# Patient Record
Sex: Male | Born: 1985 | Race: Black or African American | Hispanic: No | Marital: Single | Smoking: Current every day smoker
Health system: Southern US, Community
[De-identification: ages and names within clinical notes are randomized; demographics above are authoritative.]

---

## 2018-03-04 ENCOUNTER — Emergency Department (HOSPITAL_COMMUNITY): Payer: Self-pay

## 2018-03-04 ENCOUNTER — Emergency Department (HOSPITAL_COMMUNITY)
Admission: EM | Admit: 2018-03-04 | Discharge: 2018-03-04 | Disposition: A | Discharge status: Home / Self Care | Payer: Self-pay | Attending: Emergency Medicine | Admitting: Emergency Medicine

## 2018-03-04 ENCOUNTER — Other Ambulatory Visit: Payer: Self-pay

## 2018-03-04 ENCOUNTER — Encounter (HOSPITAL_COMMUNITY): Payer: Self-pay | Admitting: Obstetrics and Gynecology

## 2018-03-04 DIAGNOSIS — S99911A Unspecified injury of right ankle, initial encounter: Secondary | ICD-10-CM | POA: Insufficient documentation

## 2018-03-04 DIAGNOSIS — M25571 Pain in right ankle and joints of right foot: Secondary | ICD-10-CM

## 2018-03-04 DIAGNOSIS — F17210 Nicotine dependence, cigarettes, uncomplicated: Secondary | ICD-10-CM | POA: Insufficient documentation

## 2018-03-04 DIAGNOSIS — Y92002 Bathroom of unspecified non-institutional (private) residence single-family (private) house as the place of occurrence of the external cause: Secondary | ICD-10-CM | POA: Insufficient documentation

## 2018-03-04 DIAGNOSIS — X500XXA Overexertion from strenuous movement or load, initial encounter: Secondary | ICD-10-CM | POA: Insufficient documentation

## 2018-03-04 DIAGNOSIS — Y93E1 Activity, personal bathing and showering: Secondary | ICD-10-CM | POA: Insufficient documentation

## 2018-03-04 DIAGNOSIS — Y998 Other external cause status: Secondary | ICD-10-CM | POA: Insufficient documentation

## 2018-03-04 NOTE — ED Triage Notes (Signed)
Per Pt: Pt reports pain and burning in his right ankle. Pt reports he fell out of the shower and rolled his ankle.

## 2018-03-04 NOTE — Discharge Instructions (Addendum)
You have been seen today for an ankle/foot injury. There were no acute abnormalities on the x-rays, including no sign of fracture or dislocation, however, there could be injuries to the soft tissues, such as the ligaments or tendons that are not seen on xrays. There could also be what are called occult fractures that are small fractures not seen on xray. Antiinflammatory medications: Take 600 mg of ibuprofen every 6 hours or 440 mg (over the counter dose) to 500 mg (prescription dose) of naproxen every 12 hours for the next 3 days. After this time, these medications may be used as needed for pain. Take these medications with food to avoid upset stomach. Choose only one of these medications, do not take them together. Acetaminophen (generic for Tylenol): Should you continue to have additional pain while taking the ibuprofen or naproxen, you may add in acetaminophen as needed. Your daily total maximum amount of acetaminophen from all sources should be limited to 4000mg /day for persons without liver problems, or 2000mg /day for those with liver problems. Ice: May apply ice to the area over the next 24 hours for 15 minutes at a time to reduce swelling. Elevation: Keep the extremity elevated as often as possible to reduce pain and inflammation. Support: Wear the ankle brace for support and comfort. Wear this until pain resolves. You will be weight-bearing as tolerated, which means you can slowly start to put weight on the extremity and increase amount and frequency as pain allows. Exercises: Start by performing these exercises a few times a week, increasing the frequency until you are performing them twice daily.  Follow up: If symptoms are improving, you may follow up with your primary care provider for any continued management. If symptoms are not starting to improve within a week, you should follow up with the foot/ankle specialist within two weeks. Return: Return to the ED for numbness, weakness, increasing  pain, overall worsening symptoms, loss of function, or if symptoms are not improving, you have tried to follow up with the foot/ankle specialist, and have been unable to do so.  For prescription assistance, may try using prescription discount sites or apps, such as goodrx.com

## 2018-03-04 NOTE — ED Provider Notes (Signed)
[facility]-EMERGENCY DEPT Provider Note   CSN: [id number] Arrival date & time: 03/04/18  1202     History   Chief Complaint Chief Complaint  Patient presents with  . Ankle Pain    HPI Brandon Drake is a 32 y.o. male.  HPI   Brandon Drake is a 32 y.o. male, patient with no pertinent past medical history, presenting to the ED with right ankle pain beginning last night.  States he "rolled" his right ankle getting out of the shower.  He has burning pain to the lateral, proximal right foot into the lateral ankle, moderate in intensity, only occurs with bearing weight.  He also notes a "clicking sound" in the ankle when he moves around.  He has no pain when at rest. Denies numbness, weakness, swelling, other injuries, or any other complaints.     History reviewed. No pertinent past medical history.  There are no active problems to display for this patient.   History reviewed. No pertinent surgical history.      Home Medications    Prior to Admission medications   Not on File    Family History No family history on file.  Social History Social History   Tobacco Use  . Smoking status: Current Every Day Smoker    Packs/day: 0.50    Years: 10.00    Pack years: 5.00    Types: Cigarettes  . Smokeless tobacco: Never Used  Substance Use Topics  . Alcohol use: Yes    Alcohol/week: 7.0 standard drinks    Types: 7 Cans of beer per week  . Drug use: Yes    Types: Marijuana     Allergies   Patient has no known allergies.   Review of Systems Review of Systems  Musculoskeletal: Positive for arthralgias. Negative for joint swelling.  Neurological: Negative for weakness and numbness.     Physical Exam Updated Vital Signs BP 129/79 (BP Location: Right Arm)   Pulse 69   Temp 98.5 F (36.9 C) (Oral)   Resp 16   Ht 6\' 3"  (1.905 m)   Wt 86.2 kg   SpO2 95%   BMI 23.75 kg/m   Physical Exam  Constitutional: He appears well-developed and  well-nourished. No distress.  HENT:  Head: Normocephalic and atraumatic.  Eyes: Conjunctivae are normal.  Neck: Neck supple.  Cardiovascular: Normal rate, regular rhythm and intact distal pulses.  Pulmonary/Chest: Effort normal.  Musculoskeletal: He exhibits no edema, tenderness or deformity.  No tenderness, swelling, deformity, or color change to the right foot or ankle.  It does sound as though he has some popping or crunching with movement of the right ankle. He has full range of motion in the right foot and ankle.  Neurological: He is alert.  Sensation to light touch grossly intact in the right lower extremity. Strength 5/5 in the lower extremity. Patient has a somewhat antalgic gait, walking on the ball of his foot.  Skin: Skin is warm and dry. Capillary refill takes less than 2 seconds. He is not diaphoretic. No pallor.  Psychiatric: He has a normal mood and affect. His behavior is normal.  Nursing note and vitals reviewed.    ED Treatments / Results  Labs (all labs ordered are listed, but only abnormal results are displayed) Labs Reviewed - No data to display  EKG None  Radiology Dg Ankle Complete Right  Result Date: 03/04/2018 CLINICAL DATA:  Fall. EXAM: RIGHT FOOT COMPLETE - 3+ VIEW; RIGHT ANKLE - COMPLETE 3+ VIEW  COMPARISON:  None. FINDINGS: No acute fracture or dislocation. The ankle mortise is symmetric. The talar dome is intact. No tibiotalar joint effusion. Joint spaces are preserved. Bone mineralization is normal. Soft tissues are unremarkable. IMPRESSION: No acute osseous abnormality of the right ankle and foot. Electronically Signed   By: Obie Dredge M.D.   On: 03/04/2018 13:14   Dg Foot Complete Right  Result Date: 03/04/2018 CLINICAL DATA:  Fall. EXAM: RIGHT FOOT COMPLETE - 3+ VIEW; RIGHT ANKLE - COMPLETE 3+ VIEW COMPARISON:  None. FINDINGS: No acute fracture or dislocation. The ankle mortise is symmetric. The talar dome is intact. No tibiotalar joint  effusion. Joint spaces are preserved. Bone mineralization is normal. Soft tissues are unremarkable. IMPRESSION: No acute osseous abnormality of the right ankle and foot. Electronically Signed   By: Obie Dredge M.D.   On: 03/04/2018 13:14    Procedures Procedures (including critical care time)  Medications Ordered in ED Medications - No data to display   Initial Impression / Assessment and Plan / ED Course  I have reviewed the triage vital signs and the nursing notes.  Pertinent labs & imaging results that were available during my care of the patient were reviewed by me and considered in my medical decision making (see chart for details).     Patient presents with pain in the right ankle with ambulation.  Neurovascularly intact on exam.  Ankle brace and crutches for comfort, specialist follow-up, as needed. The patient was given instructions for home care as well as return precautions. Patient voices understanding of these instructions, accepts the plan, and is comfortable with discharge.  Final Clinical Impressions(s) / ED Diagnoses   Final diagnoses:  Acute right ankle pain    ED Discharge Orders    None       Concepcion Living 03/04/18 1324    Lorre Nick, MD 03/07/18 [phone]

## 2019-11-07 IMAGING — CR DG FOOT COMPLETE 3+V*R*
3 series · 3 of 3 positions shown · non-contrast
Comparison: None.

CLINICAL DATA: Fall.

EXAM:
RIGHT FOOT COMPLETE - 3+ VIEW; RIGHT ANKLE - COMPLETE 3+ VIEW

[x foot ap right]
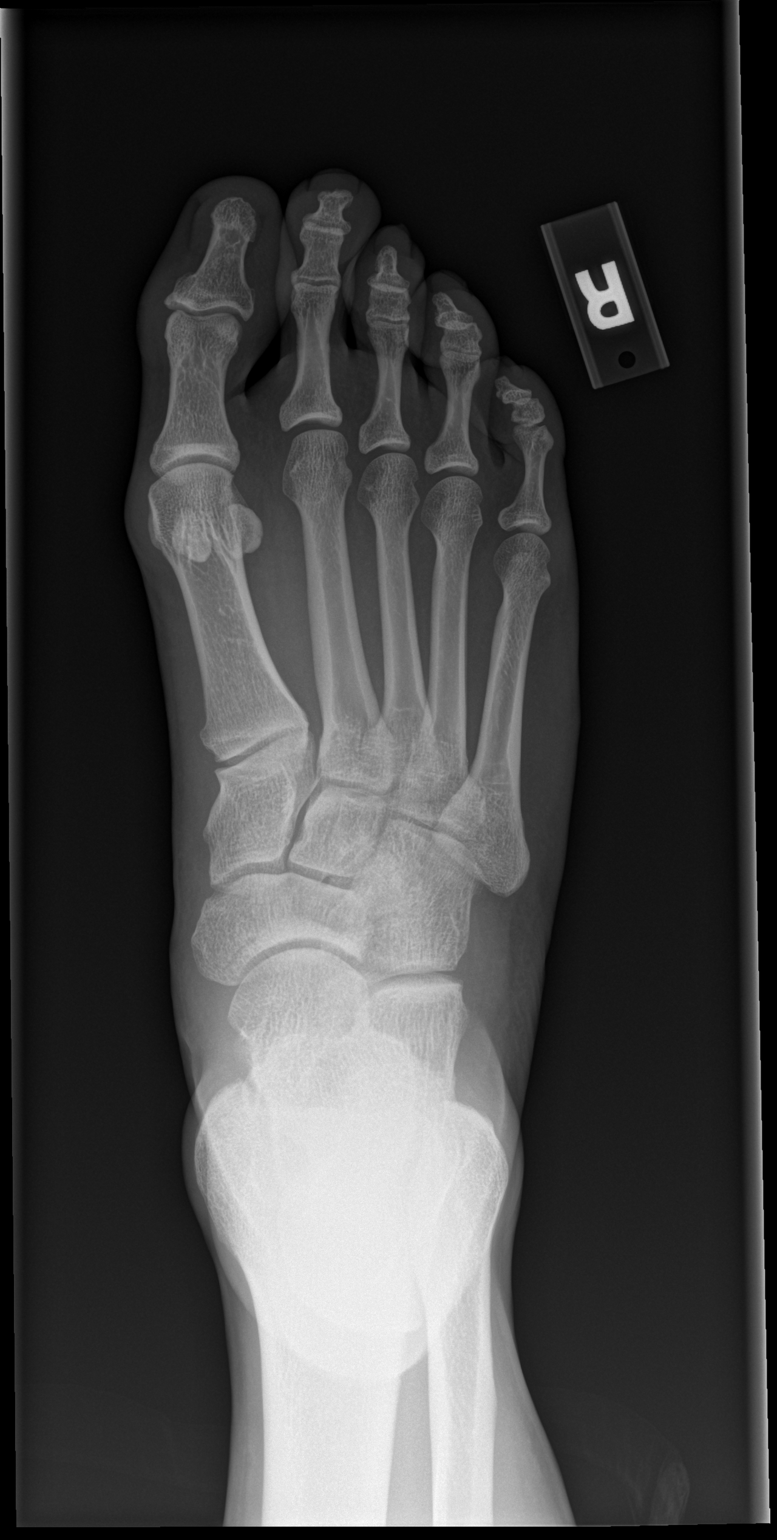

[x foot obl right]
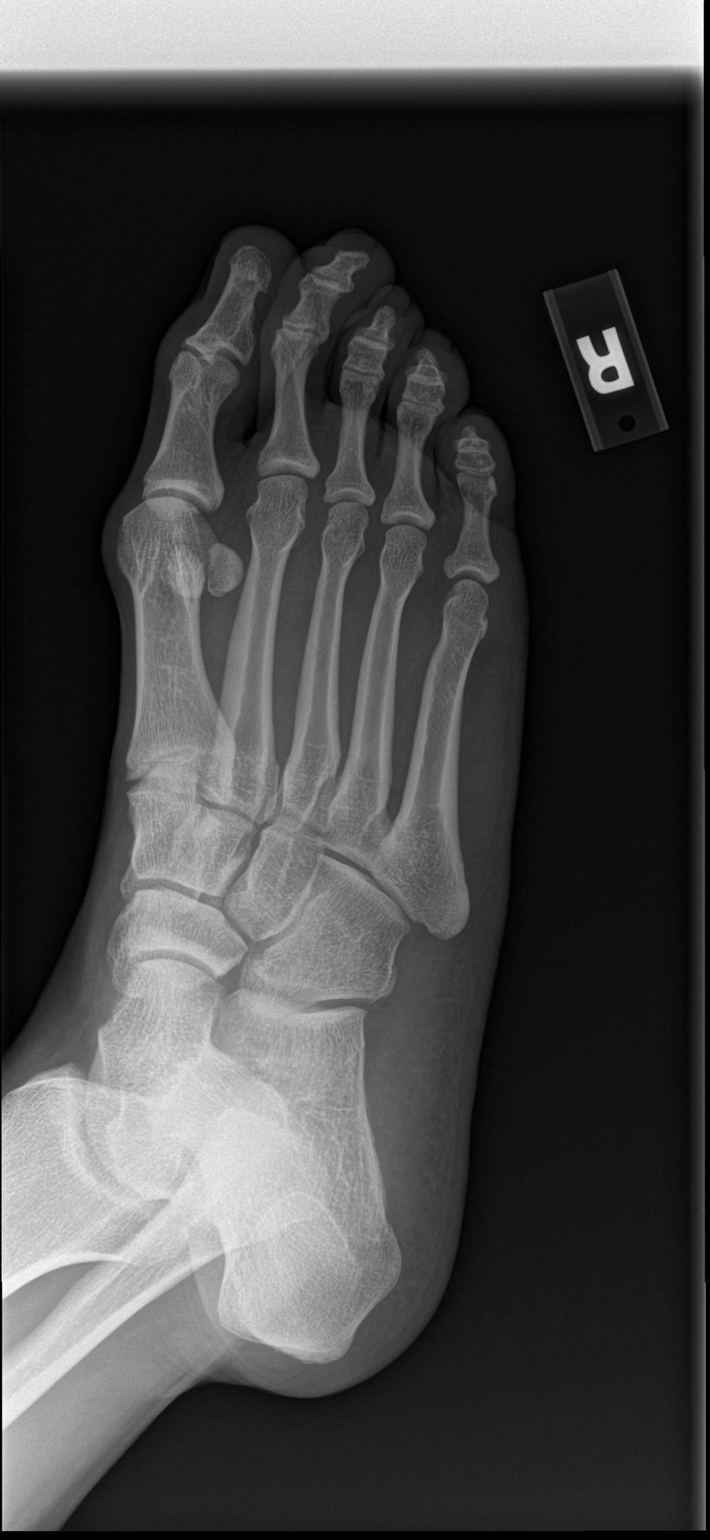

[x foot lat right]
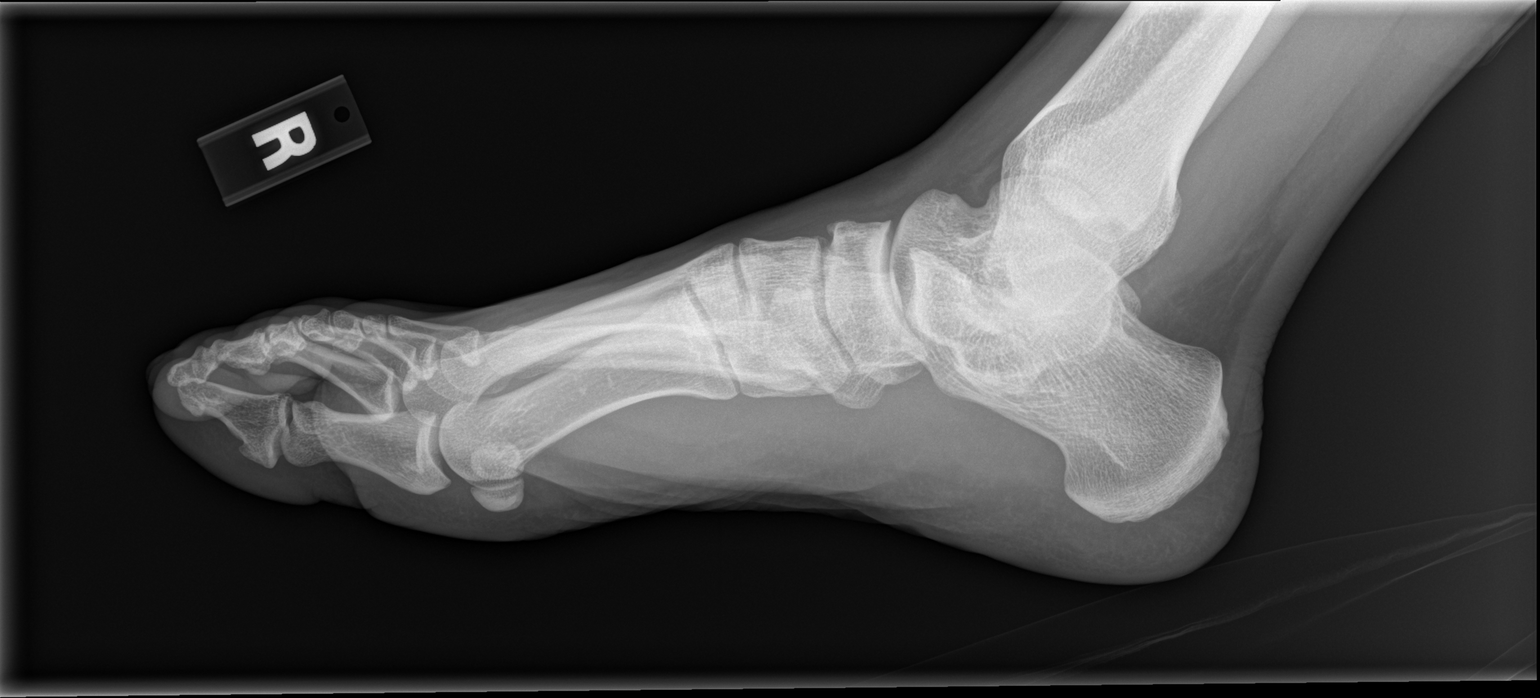

[3 of 3 positions shown; findings below may reference images not displayed]

FINDINGS: No acute fracture or dislocation. The ankle mortise is symmetric.
The talar dome is intact. No tibiotalar joint effusion. Joint spaces
are preserved. Bone mineralization is normal. Soft tissues are
unremarkable.
IMPRESSION: No acute osseous abnormality of the right ankle and foot.

## 2019-11-07 IMAGING — CR DG ANKLE COMPLETE 3+V*R*
3 series · 3 of 3 positions shown · non-contrast
Comparison: None.

CLINICAL DATA: Fall.

EXAM:
RIGHT FOOT COMPLETE - 3+ VIEW; RIGHT ANKLE - COMPLETE 3+ VIEW

[x ankle ap right]
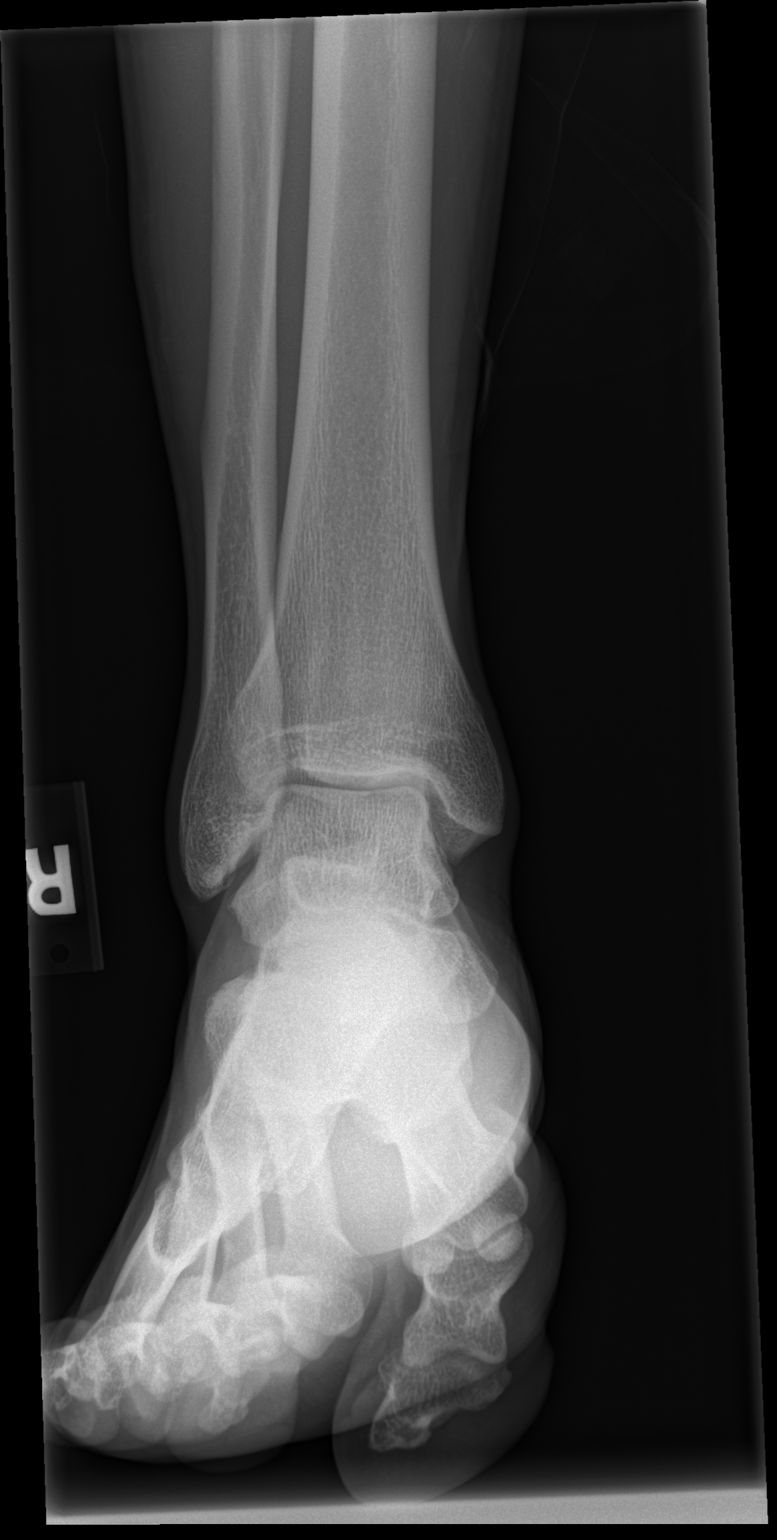

[x ankle obl right]
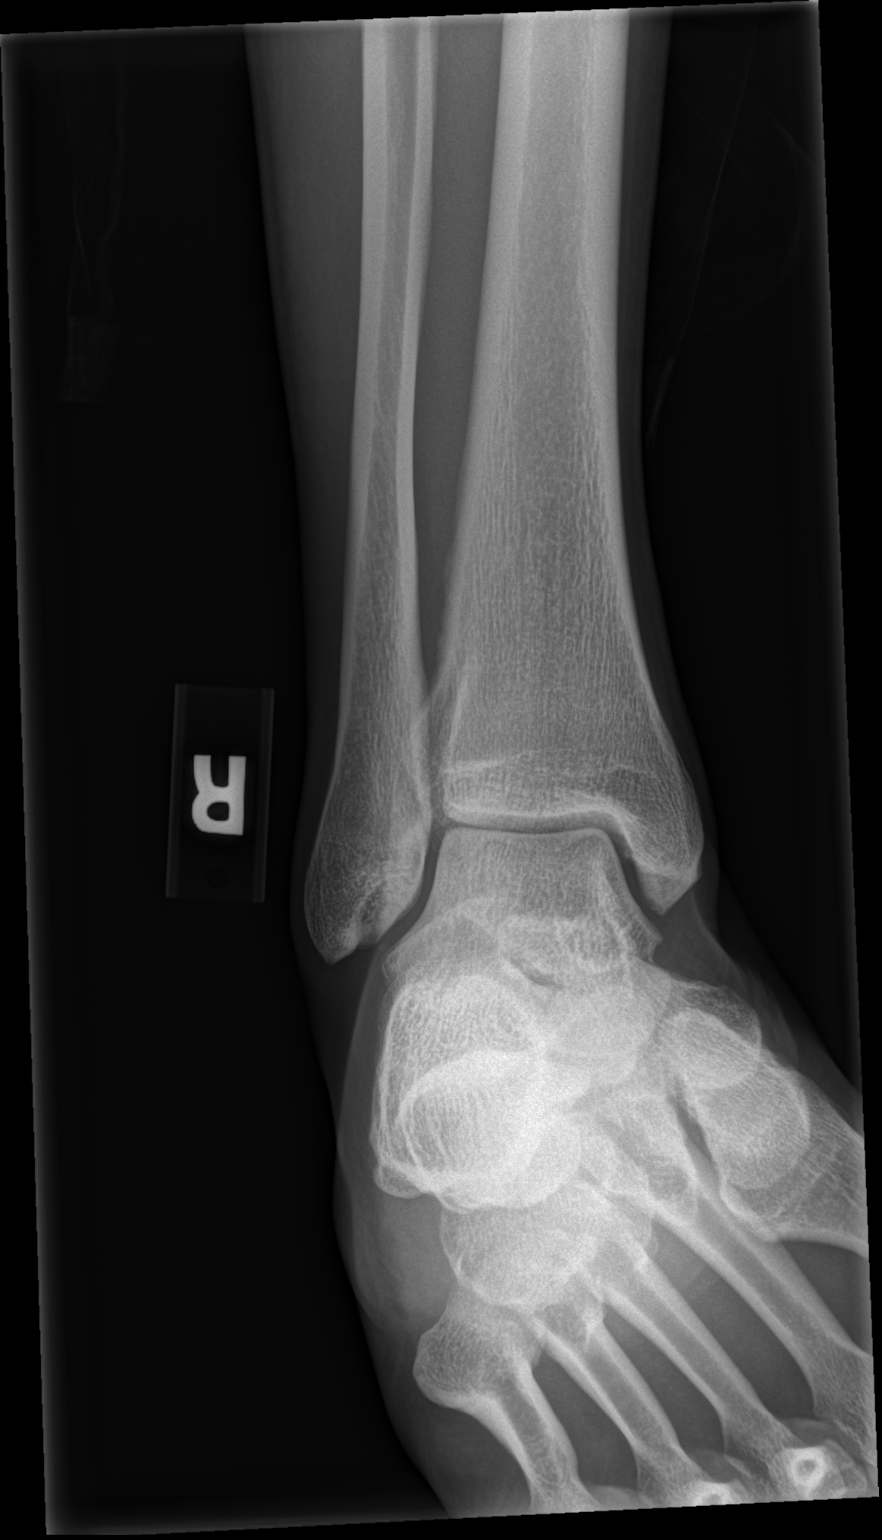

[x ankle lat right]
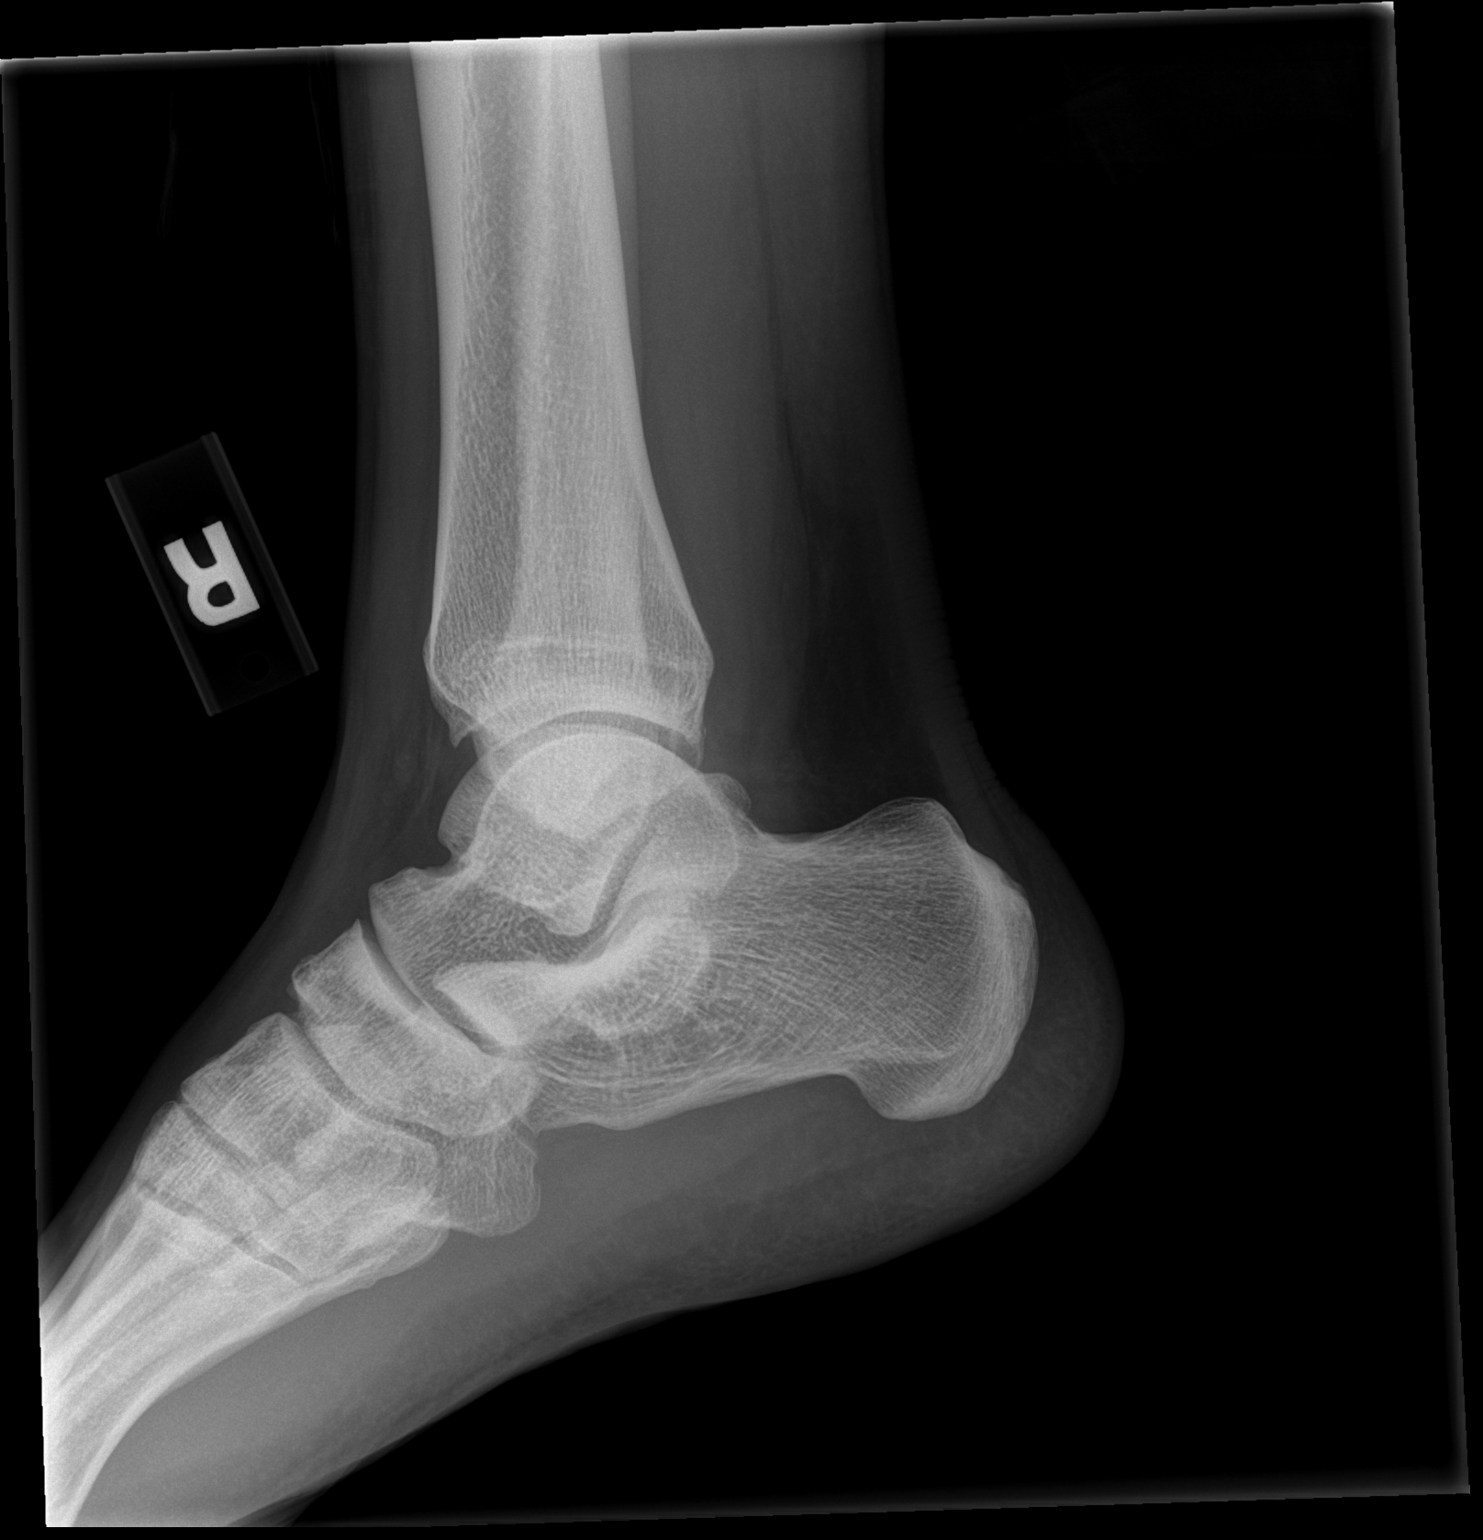

[3 of 3 positions shown; findings below may reference images not displayed]

FINDINGS: No acute fracture or dislocation. The ankle mortise is symmetric.
The talar dome is intact. No tibiotalar joint effusion. Joint spaces
are preserved. Bone mineralization is normal. Soft tissues are
unremarkable.
IMPRESSION: No acute osseous abnormality of the right ankle and foot.
